# Patient Record
Sex: Male | Born: 1945 | Race: White | Hispanic: No | Marital: Single | Smoking: Never smoker
Health system: Southern US, Community
[De-identification: ages and names within clinical notes are randomized; demographics above are authoritative.]

## PROBLEM LIST (undated history)

## (undated) DIAGNOSIS — E119 Type 2 diabetes mellitus without complications: Secondary | ICD-10-CM

## (undated) DIAGNOSIS — K219 Gastro-esophageal reflux disease without esophagitis: Secondary | ICD-10-CM

## (undated) DIAGNOSIS — B20 Human immunodeficiency virus [HIV] disease: Secondary | ICD-10-CM

## (undated) DIAGNOSIS — K7290 Hepatic failure, unspecified without coma: Secondary | ICD-10-CM

## (undated) DIAGNOSIS — I8500 Esophageal varices without bleeding: Secondary | ICD-10-CM

## (undated) DIAGNOSIS — Z21 Asymptomatic human immunodeficiency virus [HIV] infection status: Secondary | ICD-10-CM

## (undated) HISTORY — PX: TONSILLECTOMY: SUR1361

## (undated) HISTORY — PX: SHOULDER SURGERY: SHX246

---

## 2017-02-01 ENCOUNTER — Encounter (HOSPITAL_BASED_OUTPATIENT_CLINIC_OR_DEPARTMENT_OTHER): Payer: Self-pay | Admitting: Emergency Medicine

## 2017-02-01 ENCOUNTER — Emergency Department (HOSPITAL_BASED_OUTPATIENT_CLINIC_OR_DEPARTMENT_OTHER): Payer: Medicare Other

## 2017-02-01 ENCOUNTER — Emergency Department (HOSPITAL_BASED_OUTPATIENT_CLINIC_OR_DEPARTMENT_OTHER)
Admission: EM | Admit: 2017-02-01 | Discharge: 2017-02-01 | Disposition: A | Discharge status: Home / Self Care | Payer: Medicare Other | Attending: Emergency Medicine | Admitting: Emergency Medicine

## 2017-02-01 DIAGNOSIS — E119 Type 2 diabetes mellitus without complications: Secondary | ICD-10-CM | POA: Insufficient documentation

## 2017-02-01 DIAGNOSIS — Z79899 Other long term (current) drug therapy: Secondary | ICD-10-CM | POA: Diagnosis not present

## 2017-02-01 DIAGNOSIS — S52571A Other intraarticular fracture of lower end of right radius, initial encounter for closed fracture: Secondary | ICD-10-CM | POA: Diagnosis not present

## 2017-02-01 DIAGNOSIS — S6991XA Unspecified injury of right wrist, hand and finger(s), initial encounter: Secondary | ICD-10-CM | POA: Diagnosis present

## 2017-02-01 DIAGNOSIS — Y9289 Other specified places as the place of occurrence of the external cause: Secondary | ICD-10-CM | POA: Diagnosis not present

## 2017-02-01 DIAGNOSIS — W01198A Fall on same level from slipping, tripping and stumbling with subsequent striking against other object, initial encounter: Secondary | ICD-10-CM | POA: Diagnosis not present

## 2017-02-01 DIAGNOSIS — Y999 Unspecified external cause status: Secondary | ICD-10-CM | POA: Insufficient documentation

## 2017-02-01 DIAGNOSIS — Z794 Long term (current) use of insulin: Secondary | ICD-10-CM | POA: Diagnosis not present

## 2017-02-01 DIAGNOSIS — Y93H2 Activity, gardening and landscaping: Secondary | ICD-10-CM | POA: Insufficient documentation

## 2017-02-01 HISTORY — DX: Type 2 diabetes mellitus without complications: E11.9

## 2017-02-01 HISTORY — DX: Gastro-esophageal reflux disease without esophagitis: K21.9

## 2017-02-01 HISTORY — DX: Asymptomatic human immunodeficiency virus (hiv) infection status: Z21

## 2017-02-01 HISTORY — DX: Human immunodeficiency virus (HIV) disease: B20

## 2017-02-01 HISTORY — DX: Esophageal varices without bleeding: I85.00

## 2017-02-01 HISTORY — DX: Hepatic failure, unspecified without coma: K72.90

## 2017-02-01 MED ORDER — HYDROCODONE-ACETAMINOPHEN 5-325 MG PO TABS: 1.0000 | ORAL_TABLET | Freq: Four times a day (QID) | ORAL | 0 refills | Status: AC | PRN

## 2017-02-01 MED ORDER — HYDROCODONE-ACETAMINOPHEN 5-325 MG PO TABS
2.0000 | ORAL_TABLET | Freq: Once | ORAL | Status: AC
  Administered 2017-02-01: 2 via ORAL
  Filled 2017-02-01: qty 2

## 2017-02-01 NOTE — ED Notes (Signed)
Patient transported to X-ray 

## 2017-02-01 NOTE — ED Provider Notes (Signed)
[facility]   CSN: [id number] Arrival date & time: 02/01/17  1640  By signing my name below, I, Nathan David, attest that this documentation has been prepared under the direction and in the presence of Geoffery Lyons, MD. Electronically Signed: Diona David, ED Scribe. 02/01/17. 6:30 PM.  History   Chief Complaint Chief Complaint  Patient presents with  . Fall    HPI Nathan David is a 71 y.o. male who presents to the Emergency Department complaining of right wrist and hand pain s/p falling earlier today. Pt reports he was gardening when he tripped over the garden hose falling forward onto his right side hitting his right chest and injuring his hand and wrist. No modifying factors noted. Pt denies LOC and head injury.  The history is provided by the patient. No language interpreter was used.  Fall  This is a new problem. The current episode started 3 to 5 hours ago. The problem occurs constantly. The problem has not changed since onset.Associated symptoms include chest pain.    Past Medical History:  Diagnosis Date  . Diabetes mellitus without complication (HCC)   . GERD (gastroesophageal reflux disease)   . HIV (human immunodeficiency virus infection) (HCC)   . Liver failure (HCC)   . Varices, esophageal (HCC)     There are no active problems to display for this patient.   Past Surgical History:  Procedure Laterality Date  . SHOULDER SURGERY    . TONSILLECTOMY       Home Medications    Prior to Admission medications   Medication Sig Start Date End Date Taking? Authorizing Provider  escitalopram (LEXAPRO) 20 MG tablet Take 20 mg by mouth daily.   Yes [provider]  insulin regular (NOVOLIN R,HUMULIN R) 100 units/mL injection Inject into the skin 3 (three) times daily before meals.   Yes [provider]  lactulose (CHRONULAC) 10 GM/15ML solution Take by mouth 3 (three) times daily.   Yes [provider]    metFORMIN (GLUCOPHAGE) 500 MG tablet Take by mouth 2 (two) times daily with a meal.   Yes [provider]    Family History No family history on file.  Social History Social History  Substance Use Topics  . Smoking status: Never Smoker  . Smokeless tobacco: Never Used  . Alcohol use Not on file     Allergies   Ddi [didanosine]   Review of Systems Review of Systems  Cardiovascular: Positive for chest pain.  Musculoskeletal: Positive for arthralgias and joint swelling.  Neurological: Negative for syncope.  All other systems reviewed and are negative.    Physical Exam Updated Vital Signs BP (!) 113/49 (BP Location: Right Arm)   Pulse 71   Temp 98.3 F (36.8 C) (Oral)   Resp 18   Ht 5\' 7"  (1.702 m)   Wt 160 lb (72.6 kg)   SpO2 100%   BMI 25.06 kg/m   Physical Exam  Constitutional: He is oriented to person, place, and time. He appears well-developed and well-nourished.  HENT:  Head: Normocephalic and atraumatic.  Eyes: EOM are normal.  Neck: Normal range of motion.  Cardiovascular: Normal rate, regular rhythm, normal heart sounds and intact distal pulses.   Pulmonary/Chest: Effort normal and breath sounds normal. No respiratory distress. He exhibits tenderness.  TTP over the right upper lateral chest wall. No crepitus.  Abdominal: Soft. He exhibits no distension. There is no tenderness.  Musculoskeletal: Normal range of motion.  Swelling and TTP over  the distal radius of the right upper extremity. Distal PMS intact.   Neurological: He is alert and oriented to person, place, and time.  Skin: Skin is warm and dry.  Psychiatric: He has a normal mood and affect. Judgment normal.  Nursing note and vitals reviewed.    ED Treatments / Results  DIAGNOSTIC STUDIES: Oxygen Saturation is 100% on RA, normal by my interpretation.  COORDINATION OF CARE: 6:30 PM-Discussed next steps with pt which includes a temporary splint. Pt will need to follow up with a  hand surgeon to get a hard cast. Pt verbalized understanding and is agreeable with the plan.   Labs (all labs ordered are listed, but only abnormal results are displayed) Labs Reviewed - No data to display  EKG  EKG Interpretation None       Radiology Dg Wrist Complete Right  Result Date: 02/01/2017 CLINICAL DATA:  Larey Seat while gardening today. Right hand and wrist pain. EXAM: RIGHT HAND - COMPLETE 3+ VIEW; RIGHT WRIST - COMPLETE 3+ VIEW COMPARISON:  None. FINDINGS: The joint spaces are maintained. Mild degenerative changes involving the hand. There is a remote healed fifth metacarpal neck fracture. No acute hand fractures. Wrist films demonstrate a minimally displaced intra-articular fracture of the distal radius. No ulnar styloid fracture. The carpal bones are intact. IMPRESSION: Intra-articular fracture of the distal radius with minimal volar displacement of a volar cortical component. No acute hand fractures. Electronically Signed   By: Rudie Meyer M.D.   On: 02/01/2017 17:44   Dg Hand Complete Right  Result Date: 02/01/2017 CLINICAL DATA:  Larey Seat while gardening today. Right hand and wrist pain. EXAM: RIGHT HAND - COMPLETE 3+ VIEW; RIGHT WRIST - COMPLETE 3+ VIEW COMPARISON:  None. FINDINGS: The joint spaces are maintained. Mild degenerative changes involving the hand. There is a remote healed fifth metacarpal neck fracture. No acute hand fractures. Wrist films demonstrate a minimally displaced intra-articular fracture of the distal radius. No ulnar styloid fracture. The carpal bones are intact. IMPRESSION: Intra-articular fracture of the distal radius with minimal volar displacement of a volar cortical component. No acute hand fractures. Electronically Signed   By: Rudie Meyer M.D.   On: 02/01/2017 17:44    Procedures Procedures (including critical care time)  Medications Ordered in ED Medications - No data to display   Initial Impression / Assessment and Plan / ED Course  I have  reviewed the triage vital signs and the nursing notes.  Pertinent labs & imaging results that were available during my care of the patient were reviewed by me and considered in my medical decision making (see chart for details).  Patient with history of HIV disease, liver failure, diabetes. He presents for evaluation of a right wrist injury. He reports falling today and injuring it. His x-rays reveal a comminuted, intra-articular, distal radius fracture. This finding has been discussed with Dr. Melvyn Novas from hand surgery who is willing to see the patient in follow-up. The patient does have an established orthopedist here in [facility] and will attempt to make follow-up with him, and will see Dr. Melvyn Novas if these arrangements cannot be made. He was placed in a sugar tong splint, advised ice, rest, elevate.  Final Clinical Impressions(s) / ED Diagnoses   Final diagnoses:  None    New Prescriptions New Prescriptions   No medications on file   I personally performed the services described in this documentation, which was scribed in my presence. The recorded information has been reviewed and is accurate.  Geoffery Lyonselo, Zia Najera, MD 02/01/17 2127

## 2017-02-01 NOTE — ED Notes (Addendum)
Pt reports right chest pain. Pt reports having shoulder surgery with pins placed in 2014, pt reports he has intermittent right chest pain since the surgery.

## 2017-02-01 NOTE — Discharge Instructions (Signed)
Wear splint as applied until followed up by your orthopedist.  Ice for 20 minutes every 2 hours while awake for the next 2 days.  You should follow-up with your orthopedic surgeon in the next 3-4 days. Call on Monday to make these arrangements.  Hydrocodone as prescribed as needed for pain.

## 2017-02-01 NOTE — ED Triage Notes (Signed)
Patient states that he was gardening and fell in the garden  - hurt her right wrist and hand

## 2017-02-01 NOTE — ED Notes (Signed)
Pt back in waiting room 

## 2018-06-06 ENCOUNTER — Emergency Department (HOSPITAL_BASED_OUTPATIENT_CLINIC_OR_DEPARTMENT_OTHER): Payer: Medicare Other

## 2018-06-06 ENCOUNTER — Other Ambulatory Visit: Payer: Self-pay

## 2018-06-06 ENCOUNTER — Emergency Department (HOSPITAL_BASED_OUTPATIENT_CLINIC_OR_DEPARTMENT_OTHER)
Admission: EM | Admit: 2018-06-06 | Discharge: 2018-06-06 | Disposition: A | Discharge status: Home / Self Care | Payer: Medicare Other | Attending: Emergency Medicine | Admitting: Emergency Medicine

## 2018-06-06 ENCOUNTER — Encounter (HOSPITAL_BASED_OUTPATIENT_CLINIC_OR_DEPARTMENT_OTHER): Payer: Self-pay | Admitting: *Deleted

## 2018-06-06 DIAGNOSIS — Y939 Activity, unspecified: Secondary | ICD-10-CM | POA: Insufficient documentation

## 2018-06-06 DIAGNOSIS — Z79899 Other long term (current) drug therapy: Secondary | ICD-10-CM | POA: Diagnosis not present

## 2018-06-06 DIAGNOSIS — Z21 Asymptomatic human immunodeficiency virus [HIV] infection status: Secondary | ICD-10-CM | POA: Diagnosis not present

## 2018-06-06 DIAGNOSIS — S99912A Unspecified injury of left ankle, initial encounter: Secondary | ICD-10-CM | POA: Diagnosis not present

## 2018-06-06 DIAGNOSIS — Y929 Unspecified place or not applicable: Secondary | ICD-10-CM | POA: Diagnosis not present

## 2018-06-06 DIAGNOSIS — S0990XA Unspecified injury of head, initial encounter: Secondary | ICD-10-CM | POA: Insufficient documentation

## 2018-06-06 DIAGNOSIS — Y999 Unspecified external cause status: Secondary | ICD-10-CM | POA: Diagnosis not present

## 2018-06-06 DIAGNOSIS — W11XXXA Fall on and from ladder, initial encounter: Secondary | ICD-10-CM | POA: Insufficient documentation

## 2018-06-06 DIAGNOSIS — E119 Type 2 diabetes mellitus without complications: Secondary | ICD-10-CM | POA: Diagnosis not present

## 2018-06-06 DIAGNOSIS — Z7984 Long term (current) use of oral hypoglycemic drugs: Secondary | ICD-10-CM | POA: Diagnosis not present

## 2018-06-06 DIAGNOSIS — Z7982 Long term (current) use of aspirin: Secondary | ICD-10-CM | POA: Insufficient documentation

## 2018-06-06 DIAGNOSIS — W19XXXA Unspecified fall, initial encounter: Secondary | ICD-10-CM

## 2018-06-06 NOTE — ED Notes (Signed)
ED Provider at bedside. 

## 2018-06-06 NOTE — ED Triage Notes (Signed)
He was on a ladder and the ladder fell apart. His head hit gravel and his left ribs hit the gravel. Hx of 5 broken ribs from a previous fall last week.

## 2018-06-06 NOTE — ED Provider Notes (Signed)
[facility] EMERGENCY DEPARTMENT Provider Note   CSN: [id number] Arrival date & time: 06/06/18  1554     History   Chief Complaint Chief Complaint  Patient presents with  . Fall    HPI Nathan David is a 72 y.o. male with a PMH of type 2 diabetes, HIV, liver failure who presents after sustaining a fall earlier today.  This is the patient's second fall in the last 2 weeks.  2 weeks ago, patient's dog pulled him down, causing him to fall and hit his left side, breaking 5 ribs on his left side.  He was examined at in the emergency department at that time and given Norco for pain relief.  Earlier today, patient was on his ladder when the ladder broke and fell, causing him to twist his left ankle and fall to the ground.  He hit the back of his head on gravel and also hit the previously injured left side of his chest.  He did not lose consciousness or have any seizure-like activity.  He now has pain along his left side, on his left foot, and on the back of his head.  He has no shortness of breath, chest pain, or cough.  He is able to walk on his left foot, but he has to limp.    Past Medical History:  Diagnosis Date  . Diabetes mellitus without complication (HCC)   . GERD (gastroesophageal reflux disease)   . HIV (human immunodeficiency virus infection) (HCC)   . Liver failure (HCC)   . Varices, esophageal (HCC)     There are no active problems to display for this patient.   Past Surgical History:  Procedure Laterality Date  . SHOULDER SURGERY    . TONSILLECTOMY          Home Medications    Prior to Admission medications   Medication Sig Start Date End Date Taking? Authorizing Provider  buPROPion (WELLBUTRIN) 100 MG tablet Take 100 mg by mouth 2 (two) times daily.   Yes [provider]  dimenhyDRINATE (DRAMAMINE) 50 MG tablet Take 50 mg by mouth every 8 (eight) hours as needed.   Yes [provider]  escitalopram (LEXAPRO) 20 MG tablet Take  20 mg by mouth daily.   Yes [provider]  ferrous sulfate 325 (65 FE) MG tablet Take 325 mg by mouth daily with breakfast.   Yes [provider]  insulin regular (NOVOLIN R,HUMULIN R) 100 units/mL injection Inject into the skin 3 (three) times daily before meals.   Yes [provider]  lactulose (CHRONULAC) 10 GM/15ML solution Take by mouth 3 (three) times daily.   Yes [provider]  metFORMIN (GLUCOPHAGE) 500 MG tablet Take by mouth 2 (two) times daily with a meal.   Yes [provider]  temazepam (RESTORIL) 15 MG capsule Take 15 mg by mouth at bedtime as needed for sleep.   Yes [provider]  acetaminophen (TYLENOL) 500 MG tablet Take 500 mg by mouth every 6 (six) hours as needed.    [provider]  aspirin EC 81 MG tablet Take 81 mg by mouth daily.    [provider]  emtricitabine-tenofovir (TRUVADA) 200-300 MG tablet Take 1 tablet by mouth daily.    [provider]  HYDROcodone-acetaminophen (NORCO/VICODIN) 5-325 MG tablet Take 1-2 tablets by mouth every 6 (six) hours as needed. 02/01/17   Geoffery Lyons, MD  raltegravir (ISENTRESS) 400 MG tablet Take 400 mg by mouth 2 (two) times daily.  [provider]    Family History No family history on file.  Social History Social History   Tobacco Use  . Smoking status: Never Smoker  . Smokeless tobacco: Never Used  Substance Use Topics  . Alcohol use: Not on file  . Drug use: Not on file     Allergies   Ddi [didanosine]   Review of Systems Review of Systems  Constitutional: Negative for activity change, appetite change, chills and fever.  HENT: Negative for congestion and rhinorrhea.   Respiratory: Negative for cough, chest tightness and shortness of breath.   Cardiovascular: Negative for chest pain and leg swelling.  Gastrointestinal: Negative for abdominal pain, nausea and vomiting.  Genitourinary: Negative for dysuria.    Musculoskeletal: Negative for back pain and neck pain.  Skin: Positive for wound.  Neurological: Positive for dizziness and headaches.  Psychiatric/Behavioral: Negative for confusion.     Physical Exam Updated Vital Signs BP (!) 166/63 (BP Location: Right Arm)   Pulse 74   Temp 98.2 F (36.8 C) (Oral)   Resp 16   Ht 5\' 7"  (1.702 m)   Wt 72.6 kg   SpO2 97%   BMI 25.07 kg/m   Physical Exam  Constitutional: He is oriented to person, place, and time. He appears well-developed. No distress.  Thin, elderly-appearing male  HENT:  Head: Normocephalic.  Right Ear: External ear normal.  Left Ear: External ear normal.  Nose: Nose normal.  Tender, soft tissue swelling in the left posterior parietal area of scalp  Eyes: Pupils are equal, round, and reactive to light. Conjunctivae and EOM are normal.  Neck: Normal range of motion. Neck supple.  Cardiovascular: Normal rate, regular rhythm and normal heart sounds.  No murmur heard. Pulmonary/Chest: Effort normal and breath sounds normal. No respiratory distress. He exhibits tenderness.  Abdominal: Soft. Bowel sounds are normal. There is no tenderness.  Musculoskeletal: Normal range of motion. He exhibits tenderness. He exhibits no edema or deformity.  To palpation along the fourth metatarsal of left foot; nontender on medial or lateral malleolus  Neurological: He is alert and oriented to person, place, and time.  Skin: Skin is warm and dry. He is not diaphoretic.  Superficial abrasions on left arm, bruising on bilateral arms  Psychiatric: He has a normal mood and affect. His behavior is normal. Thought content normal.     ED Treatments / Results  Labs (all labs ordered are listed, but only abnormal results are displayed) Labs Reviewed - No data to display  EKG None  Radiology Dg Chest 2 View  Result Date: 06/06/2018 CLINICAL DATA:  Fall from ladder. Left-sided chest pain. History of previous injury with fractures. EXAM:  CHEST - 2 VIEW COMPARISON:  06/02/2018 FINDINGS: Heart size is normal. Chronic interstitial lung markings appear similar. Old and recent rib fractures on the left as seen previously. No pneumothorax or hemothorax. No significant spinal finding. IMPRESSION: No active disease evident. Chronic interstitial lung markings. Old and recent rib fractures on the left. No pneumothorax or hemothorax. Electronically Signed   By: Paulina Fusi M.D.   On: 06/06/2018 19:39   Dg Ankle Complete Left  Result Date: 06/06/2018 CLINICAL DATA:  Left ankle pain after fall. EXAM: LEFT ANKLE COMPLETE - 3+ VIEW COMPARISON:  None. FINDINGS: There is no evidence of fracture, dislocation, or joint effusion. There is no evidence of arthropathy or other focal bone abnormality. Mild soft tissue swelling is seen over lateral malleolus. IMPRESSION: No fracture or dislocation is noted. Soft tissue  swelling is seen over lateral malleolus. Electronically Signed   By: Lupita Raider, M.D.   On: 06/06/2018 19:38   Ct Head Wo Contrast  Result Date: 06/06/2018 CLINICAL DATA:  Posttraumatic headache after fall. EXAM: CT HEAD WITHOUT CONTRAST TECHNIQUE: Contiguous axial images were obtained from the base of the skull through the vertex without intravenous contrast. COMPARISON:  CT scan of May 06, 2018. FINDINGS: Brain: No evidence of acute infarction, hemorrhage, hydrocephalus, extra-axial collection or mass lesion/mass effect. Vascular: No hyperdense vessel or unexpected calcification. Skull: Normal. Negative for fracture or focal lesion. Sinuses/Orbits: No acute finding. Other: Small left posterior scalp hematoma is noted. IMPRESSION: Small left posterior scalp hematoma. No acute intracranial abnormality seen. Electronically Signed   By: Lupita Raider, M.D.   On: 06/06/2018 19:11   Dg Foot Complete Left  Result Date: 06/06/2018 CLINICAL DATA:  Left foot injury suffered in a fall from a ladder today. Initial encounter. EXAM: LEFT FOOT  - COMPLETE 3+ VIEW COMPARISON:  None. FINDINGS: There is no evidence of fracture or dislocation. There is no evidence of arthropathy or other focal bone abnormality. Soft tissues are unremarkable. IMPRESSION: Negative exam. Electronically Signed   By: Drusilla Kanner M.D.   On: 06/06/2018 19:37    Procedures Procedures (including critical care time)  Medications Ordered in ED Medications - No data to display   Initial Impression / Assessment and Plan / ED Course  I have reviewed the triage vital signs and the nursing notes.  Pertinent labs & imaging results that were available during my care of the patient were reviewed by me and considered in my medical decision making (see chart for details).     Due to patient's age and frailty, we will obtain a CT CT head to rule out intracranial bleeding.  We will also obtain a chest x-ray and x-rays of patient's left foot and ankle.  All imaging studies are negative for acute abnormalities.  Patient is felt to be safe for discharge and advised to use the previously prescribed Percocet sparingly and not before driving.  Final Clinical Impressions(s) / ED Diagnoses   Final diagnoses:  Fall, initial encounter    ED Discharge Orders    None       Lennox Solders, MD 06/06/18 1955    Melene Plan, DO 06/06/18 2231

## 2019-06-13 ENCOUNTER — Encounter (HOSPITAL_BASED_OUTPATIENT_CLINIC_OR_DEPARTMENT_OTHER): Payer: Self-pay | Admitting: Emergency Medicine

## 2019-06-13 ENCOUNTER — Emergency Department (HOSPITAL_BASED_OUTPATIENT_CLINIC_OR_DEPARTMENT_OTHER)
Admission: EM | Admit: 2019-06-13 | Discharge: 2019-06-13 | Disposition: A | Discharge status: Home / Self Care | Payer: Medicare Other | Attending: Emergency Medicine | Admitting: Emergency Medicine

## 2019-06-13 ENCOUNTER — Emergency Department (HOSPITAL_BASED_OUTPATIENT_CLINIC_OR_DEPARTMENT_OTHER): Payer: Medicare Other

## 2019-06-13 ENCOUNTER — Other Ambulatory Visit: Payer: Self-pay

## 2019-06-13 DIAGNOSIS — W06XXXA Fall from bed, initial encounter: Secondary | ICD-10-CM | POA: Diagnosis not present

## 2019-06-13 DIAGNOSIS — Y929 Unspecified place or not applicable: Secondary | ICD-10-CM | POA: Diagnosis not present

## 2019-06-13 DIAGNOSIS — E119 Type 2 diabetes mellitus without complications: Secondary | ICD-10-CM | POA: Diagnosis not present

## 2019-06-13 DIAGNOSIS — R519 Headache, unspecified: Secondary | ICD-10-CM | POA: Diagnosis not present

## 2019-06-13 DIAGNOSIS — Y939 Activity, unspecified: Secondary | ICD-10-CM | POA: Insufficient documentation

## 2019-06-13 DIAGNOSIS — S40011A Contusion of right shoulder, initial encounter: Secondary | ICD-10-CM | POA: Diagnosis not present

## 2019-06-13 DIAGNOSIS — S0990XA Unspecified injury of head, initial encounter: Secondary | ICD-10-CM | POA: Diagnosis not present

## 2019-06-13 DIAGNOSIS — Y999 Unspecified external cause status: Secondary | ICD-10-CM | POA: Diagnosis not present

## 2019-06-13 DIAGNOSIS — S161XXA Strain of muscle, fascia and tendon at neck level, initial encounter: Secondary | ICD-10-CM | POA: Insufficient documentation

## 2019-06-13 DIAGNOSIS — Z21 Asymptomatic human immunodeficiency virus [HIV] infection status: Secondary | ICD-10-CM | POA: Diagnosis not present

## 2019-06-13 DIAGNOSIS — W19XXXA Unspecified fall, initial encounter: Secondary | ICD-10-CM

## 2019-06-13 MED ORDER — DICLOFENAC SODIUM 1 % TD GEL: 2.0000 g | Freq: Four times a day (QID) | TRANSDERMAL | 0 refills | Status: AC | PRN

## 2019-06-13 NOTE — ED Provider Notes (Signed)
Emergency Department Provider Note   I have reviewed the triage vital signs and the nursing notes.   HISTORY  Chief Complaint Fall   HPI Nathan David is a 73 y.o. male with PMH reviewed below presents to the emergency department for evaluation of right shoulder pain, bruising, headache since a fall approximately 7 days ago.  Patient states that he was on his bed approximately 2 feet off the ground when he was playing with his dog.  He states he was knocked over and fell onto his right side.  The patient's sister at bedside states that he has frequent falls and that he is "stubborn" when it comes to using his walker/cane consistently.  He did strike his head during the fall 1 week ago but denies loss of consciousness.  He is not anticoagulated.  He has had soreness in the right shoulder but is able to move it.  He noticed some bruising and so went to the doctor today.  Denies numbness or tingling in the arm.  He has mild right-sided neck discomfort.  Given the injury pattern and continued headache patient saw his PCP who directed him to the emergency department for imaging.    Past Medical History:  Diagnosis Date   Diabetes mellitus without complication (HCC)    GERD (gastroesophageal reflux disease)    HIV (human immunodeficiency virus infection) ([place])    Liver failure (HCC)    Varices, esophageal ([place])     There are no active problems to display for this patient.   Past Surgical History:  Procedure Laterality Date   SHOULDER SURGERY     TONSILLECTOMY      Allergies Ddi [didanosine]  No family history on file.  Social History Social History   Tobacco Use   Smoking status: Never Smoker   Smokeless tobacco: Never Used  Substance Use Topics   Alcohol use: Not Currently   Drug use: Yes    Types: Marijuana    Review of Systems  Constitutional: No fever/chills Eyes: No visual changes. ENT: No sore throat. Cardiovascular: Denies chest  pain. Respiratory: Denies shortness of breath. Gastrointestinal: No abdominal pain.  No nausea, no vomiting.  No diarrhea.  No constipation. Genitourinary: Negative for dysuria. Musculoskeletal: Negative for back pain. Positive right shoulder pain.  Skin: Negative for rash. Neurological: Negative for focal weakness or numbness. Positive HA.   10-point ROS otherwise negative.  ____________________________________________   PHYSICAL EXAM:  VITAL SIGNS: ED Triage Vitals  Enc Vitals Group     BP 06/13/19 1553 (!) 119/52     Pulse Rate 06/13/19 1553 72     Resp 06/13/19 1553 18     Temp 06/13/19 1553 98.8 F (37.1 C)     Temp Source 06/13/19 1553 Oral     SpO2 06/13/19 1553 98 %     Weight 06/13/19 1551 151 lb (68.5 kg)     Height 06/13/19 1551 5\' 7"  (1.702 m)   Constitutional: Alert and oriented. Well appearing and in no acute distress. Eyes: Conjunctivae are normal.  Head: Atraumatic. Nose: No congestion/rhinnorhea. Mouth/Throat: Mucous membranes are moist.  Neck: No stridor.  No midline cervical spine tenderness.  Mild right paraspinal tenderness over the cervical spine region. Cardiovascular: Normal rate, regular rhythm. Good peripheral circulation. Respiratory: Normal respiratory effort. Gastrointestinal: No distention.  Musculoskeletal: Bruising over the right lateral shoulder with somewhat reduced range of motion of the right shoulder especially with abduction beyond 90 degrees. Neurologic:  Normal speech and language. No gross focal  neurologic deficits are appreciated.  Skin:  Skin is warm, dry and intact. No rash noted.  ____________________________________________  RADIOLOGY  Dg Shoulder Right  Result Date: 06/13/2019 CLINICAL DATA:  Patient fell 9 days ago, with shoulder pain. EXAM: RIGHT SHOULDER - 2+ VIEW COMPARISON:  Shoulder radiographs dated 01/26/2019 FINDINGS: The patient is status post open reduction internal fixation with plate and screws for a proximal  right humeral fracture. The hardware is intact and well aligned. There is no evidence of fracture or dislocation. There is no evidence of arthropathy or other focal bone abnormality. Soft tissues are unremarkable. IMPRESSION: Negative for fracture or dislocation. No evidence of hardware complication. Electronically Signed   By: Romona Curls M.D.   On: 06/13/2019 17:54   Ct Head Wo Contrast  Result Date: 06/13/2019 CLINICAL DATA:  Follow bed 1 week ago with head pain. EXAM: CT HEAD WITHOUT CONTRAST CT CERVICAL SPINE WITHOUT CONTRAST TECHNIQUE: Multidetector CT imaging of the head and cervical spine was performed following the standard protocol without intravenous contrast. Multiplanar CT image reconstructions of the cervical spine were also generated. COMPARISON:  CT head dated 06/06/2018. FINDINGS: CT HEAD FINDINGS Brain: No evidence of acute infarction, hemorrhage, hydrocephalus, extra-axial collection or mass lesion/mass effect. Vascular: There are vascular calcifications in the carotid siphons. Skull: Normal. Negative for fracture or focal lesion. Sinuses/Orbits: There is a right maxillary sinus disease. Other: None. CT CERVICAL SPINE FINDINGS Alignment: Normal. Skull base and vertebrae: No acute fracture. No primary bone lesion or focal pathologic process. Soft tissues and spinal canal: No prevertebral fluid or swelling. No visible canal hematoma. Disc levels: Degenerative disc and joint disease is seen in the cervical spine, most significant at C6-7. Degenerative changes are also seen at the craniocervical junction. Upper chest: Negative. Other: None. IMPRESSION: 1. No acute intracranial process. 2. No acute osseous injury in the cervical spine. Electronically Signed   By: Romona Curls M.D.   On: 06/13/2019 18:00   Ct Cervical Spine Wo Contrast  Result Date: 06/13/2019 CLINICAL DATA:  Follow bed 1 week ago with head pain. EXAM: CT HEAD WITHOUT CONTRAST CT CERVICAL SPINE WITHOUT CONTRAST TECHNIQUE:  Multidetector CT imaging of the head and cervical spine was performed following the standard protocol without intravenous contrast. Multiplanar CT image reconstructions of the cervical spine were also generated. COMPARISON:  CT head dated 06/06/2018. FINDINGS: CT HEAD FINDINGS Brain: No evidence of acute infarction, hemorrhage, hydrocephalus, extra-axial collection or mass lesion/mass effect. Vascular: There are vascular calcifications in the carotid siphons. Skull: Normal. Negative for fracture or focal lesion. Sinuses/Orbits: There is a right maxillary sinus disease. Other: None. CT CERVICAL SPINE FINDINGS Alignment: Normal. Skull base and vertebrae: No acute fracture. No primary bone lesion or focal pathologic process. Soft tissues and spinal canal: No prevertebral fluid or swelling. No visible canal hematoma. Disc levels: Degenerative disc and joint disease is seen in the cervical spine, most significant at C6-7. Degenerative changes are also seen at the craniocervical junction. Upper chest: Negative. Other: None. IMPRESSION: 1. No acute intracranial process. 2. No acute osseous injury in the cervical spine. Electronically Signed   By: Romona Curls M.D.   On: 06/13/2019 18:00    ____________________________________________   PROCEDURES  Procedure(s) performed:   Procedures  None  ____________________________________________   INITIAL IMPRESSION / ASSESSMENT AND PLAN / ED COURSE  Pertinent labs & imaging results that were available during my care of the patient were reviewed by me and considered in my medical decision making (see chart for  details).   Patient presents to the emergency department with continued headache and right shoulder pain 1 week after fall.  Patient with frequent falls at home.  Will obtain CT imaging of the head to rule out subacute bleed.  Patient with some neck discomfort and pain in the arm.  Will obtain CT C-spine plain film of the right shoulder.  Patient is  neurovascularly intact in the upper and lower extremities.   CT scans of the head and cervical spine reviewed no acute findings.  Right shoulder x-rays with no acute findings.  Plan for symptom management and PCP follow-up.  Discussed ED return precautions in detail. ____________________________________________  FINAL CLINICAL IMPRESSION(S) / ED DIAGNOSES  Final diagnoses:  Fall, initial encounter  Injury of head, initial encounter  Strain of neck muscle, initial encounter  Contusion of right shoulder, initial encounter    NEW OUTPATIENT MEDICATIONS STARTED DURING THIS VISIT:  Discharge Medication List as of 06/13/2019  6:08 PM    START taking these medications   Details  diclofenac sodium (VOLTAREN) 1 % GEL Apply 2 g topically 4 (four) times daily as needed., Starting Sat 06/13/2019, Print        Note:  This document was prepared using Dragon voice recognition software and may include unintentional dictation errors.  Alona BeneJoshua Jameca Chumley, MD, [facility] Emergency Medicine    Xaviar Lunn, Arlyss RepressJoshua G, MD 06/13/19 [phone]

## 2019-06-13 NOTE — ED Triage Notes (Addendum)
Pt rolled out of bed 1 week ago. C/o R shoulder and head pain. PCP sent him for CT scan. No blood thinners.

## 2019-06-13 NOTE — Discharge Instructions (Signed)
You were seen in the Emergency Department (ED) today for a head injury. Your CT scan today was normal. You may be experiencing signs of a concussion.   Symptoms to expect from a concussion include nausea, mild to moderate headache, difficulty concentrating or sleeping, and mild lightheadedness.  These symptoms should improve over the next few days to weeks, but it may take many weeks before you feel back to normal.  Return to the emergency department or follow-up with your primary care doctor if your symptoms are not improving over this time.  Signs of a more serious head injury include vomiting, severe headache, excessive sleepiness or confusion, and weakness or numbness in your face, arms or legs.  Return immediately to the Emergency Department if you experience any of these more concerning symptoms.    Rest, avoid strenuous physical or mental activity, and avoid activities that could potentially result in another head injury until all your symptoms from this head injury are completely resolved for at least 2-3 weeks. You may take ibuprofen or acetaminophen over the counter according to label instructions for mild headache or scalp soreness.

## 2019-07-11 ENCOUNTER — Emergency Department (HOSPITAL_BASED_OUTPATIENT_CLINIC_OR_DEPARTMENT_OTHER)
Admission: EM | Admit: 2019-07-11 | Discharge: 2019-07-11 | Disposition: A | Discharge status: Home / Self Care | Payer: Medicare Other | Attending: Emergency Medicine | Admitting: Emergency Medicine

## 2019-07-11 ENCOUNTER — Encounter (HOSPITAL_BASED_OUTPATIENT_CLINIC_OR_DEPARTMENT_OTHER): Payer: Self-pay | Admitting: Emergency Medicine

## 2019-07-11 ENCOUNTER — Other Ambulatory Visit: Payer: Self-pay

## 2019-07-11 ENCOUNTER — Emergency Department (HOSPITAL_BASED_OUTPATIENT_CLINIC_OR_DEPARTMENT_OTHER): Payer: Medicare Other

## 2019-07-11 DIAGNOSIS — E119 Type 2 diabetes mellitus without complications: Secondary | ICD-10-CM | POA: Insufficient documentation

## 2019-07-11 DIAGNOSIS — W06XXXA Fall from bed, initial encounter: Secondary | ICD-10-CM | POA: Diagnosis not present

## 2019-07-11 DIAGNOSIS — Y939 Activity, unspecified: Secondary | ICD-10-CM | POA: Diagnosis not present

## 2019-07-11 DIAGNOSIS — Z7982 Long term (current) use of aspirin: Secondary | ICD-10-CM | POA: Insufficient documentation

## 2019-07-11 DIAGNOSIS — Y998 Other external cause status: Secondary | ICD-10-CM | POA: Diagnosis not present

## 2019-07-11 DIAGNOSIS — S0990XA Unspecified injury of head, initial encounter: Secondary | ICD-10-CM | POA: Insufficient documentation

## 2019-07-11 DIAGNOSIS — Y92003 Bedroom of unspecified non-institutional (private) residence as the place of occurrence of the external cause: Secondary | ICD-10-CM | POA: Diagnosis not present

## 2019-07-11 DIAGNOSIS — Z21 Asymptomatic human immunodeficiency virus [HIV] infection status: Secondary | ICD-10-CM | POA: Diagnosis not present

## 2019-07-11 DIAGNOSIS — S161XXA Strain of muscle, fascia and tendon at neck level, initial encounter: Secondary | ICD-10-CM

## 2019-07-11 DIAGNOSIS — Z79899 Other long term (current) drug therapy: Secondary | ICD-10-CM | POA: Insufficient documentation

## 2019-07-11 DIAGNOSIS — W19XXXA Unspecified fall, initial encounter: Secondary | ICD-10-CM

## 2019-07-11 LAB — CBC
HCT: 36.7 % — ABNORMAL LOW (ref 39.0–52.0)
Hemoglobin: 11.6 g/dL — ABNORMAL LOW (ref 13.0–17.0)
MCH: 31.6 pg (ref 26.0–34.0)
MCHC: 31.6 g/dL (ref 30.0–36.0)
MCV: 100 fL (ref 80.0–100.0)
Platelets: 37 10*3/uL — ABNORMAL LOW (ref 150–400)
RBC: 3.67 MIL/uL — ABNORMAL LOW (ref 4.22–5.81)
RDW: 13.8 % (ref 11.5–15.5)
WBC: 1.9 10*3/uL — ABNORMAL LOW (ref 4.0–10.5)
nRBC: 0 % (ref 0.0–0.2)

## 2019-07-11 LAB — BASIC METABOLIC PANEL
Anion gap: 8 (ref 5–15)
BUN: 13 mg/dL (ref 8–23)
CO2: 26 mmol/L (ref 22–32)
Calcium: 8.7 mg/dL — ABNORMAL LOW (ref 8.9–10.3)
Chloride: 105 mmol/L (ref 98–111)
Creatinine, Ser: 1.09 mg/dL (ref 0.61–1.24)
GFR calc Af Amer: >60 mL/min (ref >60)
GFR calc non Af Amer: >60 mL/min (ref >60)
Glucose, Bld: 175 mg/dL — ABNORMAL HIGH (ref 70–99)
Potassium: 4.1 mmol/L (ref 3.5–5.1)
Sodium: 139 mmol/L (ref 135–145)

## 2019-07-11 NOTE — ED Provider Notes (Signed)
Medical screening examination/treatment/procedure(s) were conducted as a shared visit with non-physician practitioner(s) and myself.  I personally evaluated the patient during the encounter.      ED ECG REPORT   Date: 07/11/2019  Rate: 73  Rhythm: normal sinus rhythm  QRS Axis: normal  Intervals: QT prolonged  ST/T Wave abnormalities: nonspecific ST changes  Conduction Disutrbances:none  Narrative Interpretation:   Old EKG Reviewed: none available  I have personally reviewed the EKG tracing and agree with the computerized printout as noted.   Patient with a history of HIV followed at [facility] is on antivirals.  Patient presenting today for evaluation of right shoulder pain bruising headache since a fall approximately 7 days ago.  Patient fell off his bed up approximately 2 feet off the ground when he was playing with his dog.  Family member state that he has frequent falls.  He is not anticoagulated.  Denies any loss of consciousness.  CT head neck will be done today.  Shoulder x-rays will not be done today because they have been x-rayed in the past.   Fredia Sorrow, MD 07/11/19 1423

## 2019-07-11 NOTE — ED Triage Notes (Signed)
Pt here after falling twice this week and becoming very weak and dizzy since then. No blood thinners.

## 2019-07-11 NOTE — ED Provider Notes (Signed)
[facility] EMERGENCY DEPARTMENT Provider Note   CSN: [id number] Arrival date & time: 07/11/19  1150     History   Chief Complaint Chief Complaint  Patient presents with  . Weakness  . Concussion  . Fall    HPI Nathan David is a 73 y.o. male.     HPI  Patient is 73 year old male presented today for a fall that occurred 1 week ago.  Patient states he has had persistent headache since the fall and right shoulder pain.   Patient states that over the past 2 months he has had multiple falls typically these occur because he is not using his cane which she was directed use by his PCP. Patient's sister is at bedside states that he is inconsistent with cane use.  Patient states that 1 week ago he was walking up the stairs walking with the dog holding onto leash when the dog pulled him forward and he tripped falling headfirst into a door at the top of the stairs.  Patient states that he hit the front of his head and has had headaches since that are intermittent and mild.  Patient denies any focal weakness, sensation changes, slurred speech, altered mental status confusion.  States that he did not lose consciousness during the fall or after and was able to ambulate without difficulty although he felt lightheaded.  Patient states that over the past 2 to 3 months he has been increasingly lightheaded when standing up.  Denies any persistent lightheadedness other than when going from a seated to a standing position.  States that he has been told to fluid restrict as a result of his heart failure.  States he has been compliant with this direction.  Denies any chest pain, palpitations, shortness of breath, nausea, vomiting.   Patient states that his last fall was 10/24 and he had no abnormalities on his evaluation at that time.  States that he is scheduled for cataract surgery on his right eye within the week and wanted to be medically cleared.  Patient denies any dizziness, vertigo,  difficulty walking apart from baseline weakness.  Patient states he came to ED today because he is having eye surgery done within the week and felt he might need medical clearance before the operation.   Past Medical History:  Diagnosis Date  . Diabetes mellitus without complication (HCC)   . GERD (gastroesophageal reflux disease)   . HIV (human immunodeficiency virus infection) (HCC)   . Liver failure (HCC)   . Varices, esophageal (HCC)     There are no active problems to display for this patient.   Past Surgical History:  Procedure Laterality Date  . SHOULDER SURGERY    . TONSILLECTOMY          Home Medications    Prior to Admission medications   Medication Sig Start Date End Date Taking? Authorizing Provider  acetaminophen (TYLENOL) 500 MG tablet Take 500 mg by mouth every 6 (six) hours as needed.    [provider]  aspirin EC 81 MG tablet Take 81 mg by mouth daily.    [provider]  buPROPion (WELLBUTRIN) 100 MG tablet Take 100 mg by mouth 2 (two) times daily.    [provider]  diclofenac sodium (VOLTAREN) 1 % GEL Apply 2 g topically 4 (four) times daily as needed. 06/13/19   Long, Arlyss RepressJoshua G, MD  dimenhyDRINATE (DRAMAMINE) 50 MG tablet Take 50 mg by mouth every 8 (eight) hours as needed.    [provider]  emtricitabine-tenofovir (TRUVADA) 200-300 MG tablet Take 1 tablet by mouth daily.    [provider]  escitalopram (LEXAPRO) 20 MG tablet Take 20 mg by mouth daily.    [provider]  ferrous sulfate 325 (65 FE) MG tablet Take 325 mg by mouth daily with breakfast.    [provider]  HYDROcodone-acetaminophen (NORCO/VICODIN) 5-325 MG tablet Take 1-2 tablets by mouth every 6 (six) hours as needed. 02/01/17   Geoffery Lyons, MD  insulin regular (NOVOLIN R,HUMULIN R) 100 units/mL injection Inject into the skin 3 (three) times daily before meals.    [provider]  lactulose (CHRONULAC) 10 GM/15ML  solution Take by mouth 3 (three) times daily.    [provider]  metFORMIN (GLUCOPHAGE) 500 MG tablet Take by mouth 2 (two) times daily with a meal.    [provider]  raltegravir (ISENTRESS) 400 MG tablet Take 400 mg by mouth 2 (two) times daily.    [provider]  temazepam (RESTORIL) 15 MG capsule Take 15 mg by mouth at bedtime as needed for sleep.    [provider]    Family History History reviewed. No pertinent family history.  Social History Social History   Tobacco Use  . Smoking status: Never Smoker  . Smokeless tobacco: Never Used  Substance Use Topics  . Alcohol use: Not Currently  . Drug use: Yes    Types: Marijuana     Allergies   Ddi [didanosine]   Review of Systems Review of Systems  Constitutional: Negative for chills, diaphoresis and fever.  HENT: Negative for congestion.   Respiratory: Negative for cough, chest tightness and shortness of breath.   Cardiovascular: Negative for chest pain.  Gastrointestinal: Negative for abdominal pain.  Musculoskeletal: Positive for gait problem (Chronic nonfocal).  Neurological: Positive for weakness, light-headedness and headaches. Negative for dizziness and syncope.     Physical Exam Updated Vital Signs BP (!) 125/59 (BP Location: Right Arm)   Pulse 74   Temp (!) 97.5 F (36.4 C) (Oral)   Resp 15   SpO2 97%   Physical Exam Vitals signs and nursing note reviewed.  Constitutional:      General: He is not in acute distress.    Comments: Patient is elderly, 73 year old gentleman pleasant appears stated age, able answer questions and follow commands.  In no acute distress, appears chronically frail.  HENT:     Head: Normocephalic and atraumatic.     Nose: Nose normal.  Eyes:     General: No scleral icterus. Neck:     Musculoskeletal: Normal range of motion.  Cardiovascular:     Rate and Rhythm: Normal rate and regular rhythm.     Pulses: Normal pulses.     Heart  sounds: Normal heart sounds.  Pulmonary:     Effort: Pulmonary effort is normal. No respiratory distress.     Breath sounds: No wheezing.  Abdominal:     Palpations: Abdomen is soft.     Tenderness: There is no abdominal tenderness.  Musculoskeletal:     Right lower leg: No edema.     Left lower leg: No edema.     Comments: Right shoulder with muscular tenderness over the right deltoid.  Full range of motion of right shoulder without pain with active and passive range.   No bony tenderness over joints or long bones of the upper and lower extremities.   No neck or back midline tenderness, step-off, deformity, or bruising. Able to turn  head left and right 45 degrees without difficulty.  Full range of motion of upper and lower extremity joints shown after palpation was conducted; with 5/5 symmetrical strength in upper and lower extremities. No chest wall tenderness, no facial or cranial tenderness.   Patient has intact sensation grossly in lower and upper extremities. Intact patellar and ankle reflexes. Patient able to ambulate without difficulty.  Radial and DP pulses palpated BL.    Skin:    General: Skin is warm and dry.     Capillary Refill: Capillary refill takes less than 2 seconds.  Neurological:     Mental Status: He is alert. Mental status is at baseline.  Psychiatric:        Mood and Affect: Mood normal.        Behavior: Behavior normal.      ED Treatments / Results  Labs (all labs ordered are listed, but only abnormal results are displayed) Labs Reviewed  BASIC METABOLIC PANEL - Abnormal; Notable for the following components:      Result Value   Glucose, Bld 175 (*)    Calcium 8.7 (*)    All other components within normal limits  CBC - Abnormal; Notable for the following components:   WBC 1.9 (*)    RBC 3.67 (*)    Hemoglobin 11.6 (*)    HCT 36.7 (*)    Platelets 37 (*)    All other components within normal limits    EKG None   ED ECG REPORT SEE  ATTENDING ATTESTATION.   Radiology Ct Head Wo Contrast  Result Date: 07/11/2019 CLINICAL DATA:  Multiple recent falls with head injury.  Neck pain. EXAM: CT HEAD WITHOUT CONTRAST CT CERVICAL SPINE WITHOUT CONTRAST TECHNIQUE: Multidetector CT imaging of the head and cervical spine was performed following the standard protocol without intravenous contrast. Multiplanar CT image reconstructions of the cervical spine were also generated. COMPARISON:  06/13/2019 FINDINGS: CT HEAD FINDINGS Brain: There is no evidence for acute hemorrhage, hydrocephalus, mass lesion, or abnormal extra-axial fluid collection. No definite CT evidence for acute infarction. Diffuse loss of parenchymal volume is consistent with atrophy. Patchy low attenuation in the deep hemispheric and periventricular white matter is nonspecific, but likely reflects chronic microvascular ischemic demyelination. Vascular: No hyperdense vessel or unexpected calcification. Skull: No evidence for fracture. No worrisome lytic or sclerotic lesion. Sinuses/Orbits: Chronic mucosal disease again noted right maxillary sinus. Visualized portions of the globes and intraorbital fat are unremarkable. Other: None. CT CERVICAL SPINE FINDINGS Alignment: Normal. Skull base and vertebrae: No acute fracture. No primary bone lesion or focal pathologic process. Soft tissues and spinal canal: No prevertebral fluid or swelling. No visible canal hematoma. Disc levels:  Loss of disc height noted C6-7. Upper chest: Unremarkable. Other: None. IMPRESSION: 1. No acute intracranial abnormality. 2. No cervical spine fracture. Mild loss of disc height noted at C6-7. Electronically Signed   By: Kennith Center M.D.   On: 07/11/2019 13:56   Ct Cervical Spine Wo Contrast  Result Date: 07/11/2019 CLINICAL DATA:  Multiple recent falls with head injury.  Neck pain. EXAM: CT HEAD WITHOUT CONTRAST CT CERVICAL SPINE WITHOUT CONTRAST TECHNIQUE: Multidetector CT imaging of the head and cervical  spine was performed following the standard protocol without intravenous contrast. Multiplanar CT image reconstructions of the cervical spine were also generated. COMPARISON:  06/13/2019 FINDINGS: CT HEAD FINDINGS Brain: There is no evidence for acute hemorrhage, hydrocephalus, mass lesion, or abnormal extra-axial fluid collection. No definite CT evidence for acute infarction. Diffuse  loss of parenchymal volume is consistent with atrophy. Patchy low attenuation in the deep hemispheric and periventricular white matter is nonspecific, but likely reflects chronic microvascular ischemic demyelination. Vascular: No hyperdense vessel or unexpected calcification. Skull: No evidence for fracture. No worrisome lytic or sclerotic lesion. Sinuses/Orbits: Chronic mucosal disease again noted right maxillary sinus. Visualized portions of the globes and intraorbital fat are unremarkable. Other: None. CT CERVICAL SPINE FINDINGS Alignment: Normal. Skull base and vertebrae: No acute fracture. No primary bone lesion or focal pathologic process. Soft tissues and spinal canal: No prevertebral fluid or swelling. No visible canal hematoma. Disc levels:  Loss of disc height noted C6-7. Upper chest: Unremarkable. Other: None. IMPRESSION: 1. No acute intracranial abnormality. 2. No cervical spine fracture. Mild loss of disc height noted at C6-7. Electronically Signed   By: Misty Stanley M.D.   On: 07/11/2019 13:56    Procedures Procedures (including critical care time)  Medications Ordered in ED Medications - No data to display   Initial Impression / Assessment and Plan / ED Course  I have reviewed the triage vital signs and the nursing notes.  Pertinent labs & imaging results that were available during my care of the patient were reviewed by me and considered in my medical decision making (see chart for details).        Patient is 73 year old male presenting today for fall that occurred 1 week ago.  Patient complains of  headaches, right shoulder pain, neck pain.  Ordered CT of head and cervical spine.  Ordered BMP and CBC to evaluate for anemia or hypoglycemia as patient has been progressively more weak for the past 2 months.  CT head and C-spine conducted in order to evaluate for acute abnormality.  Degenerative changes found in cervical spine however no acute abnormality.   EKG reviewed by Dr. Rogene Houston and myself. QT prolongation, no acute abnormalities concerning. History of HIV on antivirals with similarly low WBC in 2018.  Apart from mild anemia which is stable and low platelets at 37 is consistent with last physical CBC done in 2018 which was a similar value.  Otherwise reassuring blood work and vital signs.  Likely patient's weakness is explained by deconditioning and progression of age.  No acute findings on EKG, blood work, CT scans to indicate emergency condition today.  Will recommend follow-up with primary care and infectious disease with reports that he continues to see them.  Strongly encourage patient ambulate with walker or cane.  Discussed that he should no longer walk dog.   Final Clinical Impressions(s) / ED Diagnoses   Final diagnoses:  Injury of head, initial encounter  Fall, initial encounter  Strain of neck muscle, initial encounter    ED Discharge Orders    None       Tedd Sias, [place] 07/12/19 5035    Fredia Sorrow, MD 07/12/19 0900

## 2019-07-11 NOTE — ED Provider Notes (Signed)
Medical screening examination/treatment/procedure(s) were conducted as a shared visit with non-physician practitioner(s) and myself.  I personally evaluated the patient during the encounter.      Patient seen by me along with physician assistant.  See my other note.   Fredia Sorrow, MD 07/11/19 1424

## 2019-07-11 NOTE — Discharge Instructions (Addendum)
Follow-up with your primary care doctor please refrain from walking the dog and please use cane or consider using walker for ambulation.  Please use Tylenol ibuprofen for pain.

## 2021-09-29 ENCOUNTER — Emergency Department (HOSPITAL_BASED_OUTPATIENT_CLINIC_OR_DEPARTMENT_OTHER): Payer: Medicare Other

## 2021-09-29 ENCOUNTER — Other Ambulatory Visit: Payer: Self-pay

## 2021-09-29 ENCOUNTER — Encounter (HOSPITAL_BASED_OUTPATIENT_CLINIC_OR_DEPARTMENT_OTHER): Payer: Self-pay

## 2021-09-29 ENCOUNTER — Emergency Department (HOSPITAL_BASED_OUTPATIENT_CLINIC_OR_DEPARTMENT_OTHER)
Admission: EM | Admit: 2021-09-29 | Discharge: 2021-09-29 | Disposition: A | Discharge status: Home / Self Care | Payer: Medicare Other | Attending: Emergency Medicine | Admitting: Emergency Medicine

## 2021-09-29 DIAGNOSIS — Z7982 Long term (current) use of aspirin: Secondary | ICD-10-CM | POA: Diagnosis not present

## 2021-09-29 DIAGNOSIS — B20 Human immunodeficiency virus [HIV] disease: Secondary | ICD-10-CM | POA: Insufficient documentation

## 2021-09-29 DIAGNOSIS — E860 Dehydration: Secondary | ICD-10-CM | POA: Insufficient documentation

## 2021-09-29 DIAGNOSIS — Z794 Long term (current) use of insulin: Secondary | ICD-10-CM | POA: Diagnosis not present

## 2021-09-29 DIAGNOSIS — E1165 Type 2 diabetes mellitus with hyperglycemia: Secondary | ICD-10-CM | POA: Insufficient documentation

## 2021-09-29 DIAGNOSIS — Z7984 Long term (current) use of oral hypoglycemic drugs: Secondary | ICD-10-CM | POA: Diagnosis not present

## 2021-09-29 DIAGNOSIS — R519 Headache, unspecified: Secondary | ICD-10-CM | POA: Insufficient documentation

## 2021-09-29 DIAGNOSIS — N390 Urinary tract infection, site not specified: Secondary | ICD-10-CM | POA: Insufficient documentation

## 2021-09-29 DIAGNOSIS — R739 Hyperglycemia, unspecified: Secondary | ICD-10-CM

## 2021-09-29 DIAGNOSIS — R5383 Other fatigue: Secondary | ICD-10-CM | POA: Diagnosis present

## 2021-09-29 LAB — URINALYSIS, ROUTINE W REFLEX MICROSCOPIC
Bilirubin Urine: NEGATIVE
Glucose, UA: NEGATIVE mg/dL
Hgb urine dipstick: NEGATIVE
Ketones, ur: NEGATIVE mg/dL
Nitrite: NEGATIVE
Protein, ur: NEGATIVE mg/dL
Specific Gravity, Urine: 1.01 (ref 1.005–1.030)
pH: 5.5 (ref 5.0–8.0)

## 2021-09-29 LAB — CBC WITH DIFFERENTIAL/PLATELET
Abs Immature Granulocytes: 0.01 10*3/uL (ref 0.00–0.07)
Basophils Absolute: 0 10*3/uL (ref 0.0–0.1)
Basophils Relative: 0 %
Eosinophils Absolute: 0.1 10*3/uL (ref 0.0–0.5)
Eosinophils Relative: 2 %
HCT: 34 % — ABNORMAL LOW (ref 39.0–52.0)
Hemoglobin: 11.4 g/dL — ABNORMAL LOW (ref 13.0–17.0)
Immature Granulocytes: 0 %
Lymphocytes Relative: 26 %
Lymphs Abs: 0.7 10*3/uL (ref 0.7–4.0)
MCH: 31.5 pg (ref 26.0–34.0)
MCHC: 33.5 g/dL (ref 30.0–36.0)
MCV: 93.9 fL (ref 80.0–100.0)
Monocytes Absolute: 0.2 10*3/uL (ref 0.1–1.0)
Monocytes Relative: 6 %
Neutro Abs: 1.7 10*3/uL (ref 1.7–7.7)
Neutrophils Relative %: 66 %
Platelets: 39 10*3/uL — ABNORMAL LOW (ref 150–400)
RBC: 3.62 MIL/uL — ABNORMAL LOW (ref 4.22–5.81)
RDW: 13.7 % (ref 11.5–15.5)
WBC: 2.7 10*3/uL — ABNORMAL LOW (ref 4.0–10.5)
nRBC: 0 % (ref 0.0–0.2)

## 2021-09-29 LAB — URINALYSIS, MICROSCOPIC (REFLEX): RBC / HPF: NONE SEEN RBC/hpf (ref 0–5)

## 2021-09-29 LAB — COMPREHENSIVE METABOLIC PANEL
ALT: 21 U/L (ref 0–44)
AST: 33 U/L (ref 15–41)
Albumin: 3.3 g/dL — ABNORMAL LOW (ref 3.5–5.0)
Alkaline Phosphatase: 107 U/L (ref 38–126)
Anion gap: 12 (ref 5–15)
BUN: 18 mg/dL (ref 8–23)
CO2: 23 mmol/L (ref 22–32)
Calcium: 9.1 mg/dL (ref 8.9–10.3)
Chloride: 96 mmol/L — ABNORMAL LOW (ref 98–111)
Creatinine, Ser: 1.25 mg/dL — ABNORMAL HIGH (ref 0.61–1.24)
GFR, Estimated: 60 mL/min — ABNORMAL LOW (ref >60)
Glucose, Bld: 573 mg/dL (ref 70–99)
Potassium: 4.1 mmol/L (ref 3.5–5.1)
Sodium: 131 mmol/L — ABNORMAL LOW (ref 135–145)
Total Bilirubin: 0.8 mg/dL (ref 0.3–1.2)
Total Protein: 7.1 g/dL (ref 6.5–8.1)

## 2021-09-29 LAB — CBG MONITORING, ED: Glucose-Capillary: 369 mg/dL — ABNORMAL HIGH (ref 70–99)

## 2021-09-29 MED ORDER — CEPHALEXIN 500 MG PO CAPS: 500.0000 mg | ORAL_CAPSULE | Freq: Three times a day (TID) | ORAL | 0 refills | Status: AC

## 2021-09-29 MED ORDER — PROCHLORPERAZINE EDISYLATE 10 MG/2ML IJ SOLN
5.0000 mg | Freq: Once | INTRAMUSCULAR | Status: AC
Start: 2021-09-29 — End: 2021-09-29
  Administered 2021-09-29: 5 mg via INTRAVENOUS
  Filled 2021-09-29: qty 2

## 2021-09-29 MED ORDER — SODIUM CHLORIDE 0.9 % IV BOLUS
1000.0000 mL | Freq: Once | INTRAVENOUS | Status: AC
Start: 2021-09-29 — End: 2021-09-29
  Administered 2021-09-29: 1000 mL via INTRAVENOUS

## 2021-09-29 MED ORDER — DIPHENHYDRAMINE HCL 50 MG/ML IJ SOLN
25.0000 mg | Freq: Once | INTRAMUSCULAR | Status: AC
  Administered 2021-09-29: 25 mg via INTRAVENOUS
  Filled 2021-09-29: qty 1

## 2021-09-29 NOTE — Discharge Instructions (Signed)
Please follow-up with your regular doctor regarding your symptoms from today.  Please monitor your sugars at home and follow the insulin regimen that was previously prescribed.  Discussed your long-term insulin regimen with your primary doctor.  Recommend taking antibiotic as prescribed for suspected UTI.  If you develop abdominal pain, vomiting, fever or other new concerning symptom, please return to ER for reassessment.

## 2021-09-29 NOTE — ED Provider Notes (Signed)
[facility] EMERGENCY DEPARTMENT Provider Note   CSN: [id number] Arrival date & time: 09/29/21  1251     History  Chief Complaint  Patient presents with   Fatigue    Nathan David is a 76 y.o. male.  76 yo M with a chief complaints of episodic headaches fatigue going on for at least 7 months he thinks.  He has seen his family doctor for this off and on.  Has a history of HIV was diagnosed in the 37s and has been on therapy since.  He has seen his doctor for these recurrent headaches with no obvious etiology.  He takes medicine for chronic migraines.  He denies cough or congestion denies shortness of breath denies any significant change that brought him in today.       Home Medications Prior to Admission medications   Medication Sig Start Date End Date Taking? Authorizing Provider  acetaminophen (TYLENOL) 500 MG tablet Take 500 mg by mouth every 6 (six) hours as needed.    [provider]  aspirin EC 81 MG tablet Take 81 mg by mouth daily.    [provider]  buPROPion (WELLBUTRIN) 100 MG tablet Take 100 mg by mouth 2 (two) times daily.    [provider]  diclofenac sodium (VOLTAREN) 1 % GEL Apply 2 g topically 4 (four) times daily as needed. 06/13/19   Long, Arlyss Repress, MD  dimenhyDRINATE (DRAMAMINE) 50 MG tablet Take 50 mg by mouth every 8 (eight) hours as needed.    [provider]  emtricitabine-tenofovir (TRUVADA) 200-300 MG tablet Take 1 tablet by mouth daily.    [provider]  escitalopram (LEXAPRO) 20 MG tablet Take 20 mg by mouth daily.    [provider]  ferrous sulfate 325 (65 FE) MG tablet Take 325 mg by mouth daily with breakfast.    [provider]  HYDROcodone-acetaminophen (NORCO/VICODIN) 5-325 MG tablet Take 1-2 tablets by mouth every 6 (six) hours as needed. 02/01/17   Geoffery Lyons, MD  insulin regular (NOVOLIN R,HUMULIN R) 100 units/mL injection Inject into the skin 3 (three) times  daily before meals.    [provider]  lactulose (CHRONULAC) 10 GM/15ML solution Take by mouth 3 (three) times daily.    [provider]  metFORMIN (GLUCOPHAGE) 500 MG tablet Take by mouth 2 (two) times daily with a meal.    [provider]  raltegravir (ISENTRESS) 400 MG tablet Take 400 mg by mouth 2 (two) times daily.    [provider]  temazepam (RESTORIL) 15 MG capsule Take 15 mg by mouth at bedtime as needed for sleep.    [provider]      Allergies    Ddi [didanosine]    Review of Systems   Review of Systems  Physical Exam Updated Vital Signs BP (!) 101/56 (BP Location: Right Arm)    Pulse 70    Temp 98.2 F (36.8 C) (Oral)    Resp 10    SpO2 95%  Physical Exam Vitals and nursing note reviewed.  Constitutional:      Appearance: He is well-developed.     Comments: cachectic  HENT:     Head: Normocephalic and atraumatic.  Eyes:     Pupils: Pupils are equal, round, and reactive to light.  Neck:     Vascular: No JVD.  Cardiovascular:     Rate and Rhythm: Normal rate and regular rhythm.     Heart sounds: No murmur heard.  No friction rub. No gallop.  Pulmonary:     Effort: No respiratory distress.     Breath sounds: No wheezing.  Abdominal:     General: There is no distension.     Tenderness: There is no abdominal tenderness. There is no guarding or rebound.  Musculoskeletal:        General: Normal range of motion.     Cervical back: Normal range of motion and neck supple.  Skin:    Coloration: Skin is not pale.     Findings: No rash.  Neurological:     Mental Status: He is alert and oriented to person, place, and time.  Psychiatric:        Behavior: Behavior normal.    ED Results / Procedures / Treatments   Labs (all labs ordered are listed, but only abnormal results are displayed) Labs Reviewed  CBC WITH DIFFERENTIAL/PLATELET - Abnormal; Notable for the following components:      Result Value   WBC 2.7 (*)     RBC 3.62 (*)    Hemoglobin 11.4 (*)    HCT 34.0 (*)    Platelets 39 (*)    All other components within normal limits  COMPREHENSIVE METABOLIC PANEL - Abnormal; Notable for the following components:   Sodium 131 (*)    Chloride 96 (*)    Glucose, Bld 573 (*)    Creatinine, Ser 1.25 (*)    Albumin 3.3 (*)    GFR, Estimated 60 (*)    All other components within normal limits  URINALYSIS, ROUTINE W REFLEX MICROSCOPIC  CBG MONITORING, ED    EKG None  Radiology No results found.  Procedures Procedures    Medications Ordered in ED Medications  sodium chloride 0.9 % bolus 1,000 mL (has no administration in time range)  sodium chloride 0.9 % bolus 1,000 mL (1,000 mLs Intravenous New Bag/Given 09/29/21 1431)  prochlorperazine (COMPAZINE) injection 5 mg (5 mg Intravenous Given 09/29/21 1433)  diphenhydrAMINE (BENADRYL) injection 25 mg (25 mg Intravenous Given 09/29/21 1432)    ED Course/ Medical Decision Making/ A&P                           Medical Decision Making Amount and/or Complexity of Data Reviewed Labs: ordered. Radiology: ordered.  Risk Prescription drug management.   76 yo M with a cc of fatigue.  Going on for months.  Nothing particularly different as far as he knows.  His daughter was somewhat insistent that he come to be seen.  He is now not to eat and drink as much as they think he needs to.  Concern mostly from family that he is dehydrated.  On my record review he has seen his physician fairly frequently.  Has a history of headaches and this does not sound significantly different.  Has not had head imaging in about 6 months.  We will obtain a CT scan of the head.  Blood work.  IV fluids.  Headache cocktail.  Reassess.  Significant hyperglycemia no anion gap or acidosis.  Will give a second bolus of IV fluids.  Recheck blood sugar and give insulin if needed.  Patient care signed out to Dr. Stevie Kern, please see his note for further details of care in the  ED.  The patients results and plan were reviewed and discussed.   Any x-rays performed were independently reviewed by myself.   Differential diagnosis were considered with the presenting HPI.  Medications  sodium  chloride 0.9 % bolus 1,000 mL (has no administration in time range)  sodium chloride 0.9 % bolus 1,000 mL (1,000 mLs Intravenous New Bag/Given 09/29/21 1431)  prochlorperazine (COMPAZINE) injection 5 mg (5 mg Intravenous Given 09/29/21 1433)  diphenhydrAMINE (BENADRYL) injection 25 mg (25 mg Intravenous Given 09/29/21 1432)    Vitals:   09/29/21 1309 09/29/21 1400 09/29/21 1522  BP: (!) 97/58 (!) 111/50 (!) 101/56  Pulse: 74 72 70  Resp: 16 14 10   Temp: 98.2 F (36.8 C)    TempSrc: Oral    SpO2: 99% 95% 95%    Final diagnoses:  Dehydration  Hyperglycemia            Final Clinical Impression(s) / ED Diagnoses Final diagnoses:  Dehydration  Hyperglycemia    Rx / DC Orders ED Discharge Orders     None         , DO 09/29/21 1533

## 2021-09-29 NOTE — ED Provider Notes (Signed)
Sign out note  76 y/o male with extensive pmh including HIV, DM presenting for fatigue for past few months. Basic labs stable except noted hyperglycemia. At time of sign out plan to provide fluids, recheck cbg and likely dc home with plan to f/u with pcp for further management.   His repeat sugar is improved after some fluids.  His UA shows possible UTI.  Patient states all of his symptoms have improved since receiving the fluids.  Will give Rx for UTI, recommended strict return precautions should symptoms worsen otherwise follow-up with PCP.   Lucrezia Starch, MD 09/29/21 2253

## 2021-09-29 NOTE — ED Triage Notes (Signed)
Pt c/o fatigue x 2-3 months-states he "collapsed last week"-states he was seen by infectious disease last week-NAD- to triage in w/c

## 2021-09-29 NOTE — ED Notes (Signed)
Patient transported to CT 

## 2022-06-20 DEATH — deceased

## 2022-09-28 IMAGING — CT CT HEAD W/O CM
3 series · 15 of 47 positions shown, 18 images · non-contrast
Comparison: 07/11/2019

CLINICAL DATA: New or worsening headache, fatigue for 2-3 months,
collapsed last week, history HIV, diabetes mellitus, liver failure,
esophageal varices



[Series 2: head wo · axial · 0.40mm/px · z∈[+928,+1064]mm · 9 of 33 slices shown, 12 images]
[im 3/33  brain]
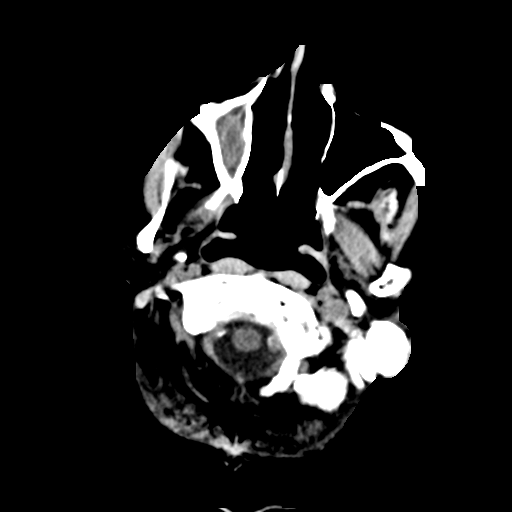
[im 3/33  bone]
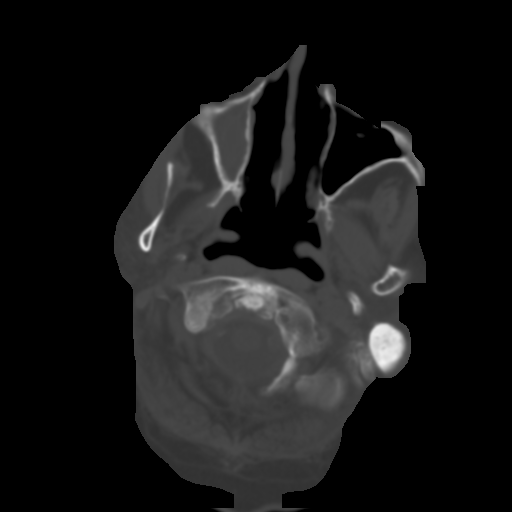
[im 6/33  brain]
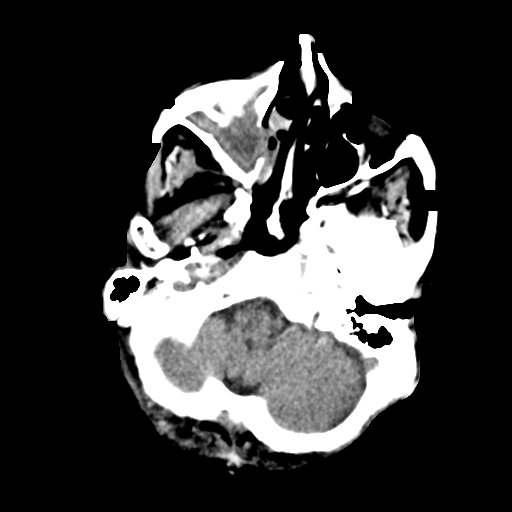
[im 9/33  brain]
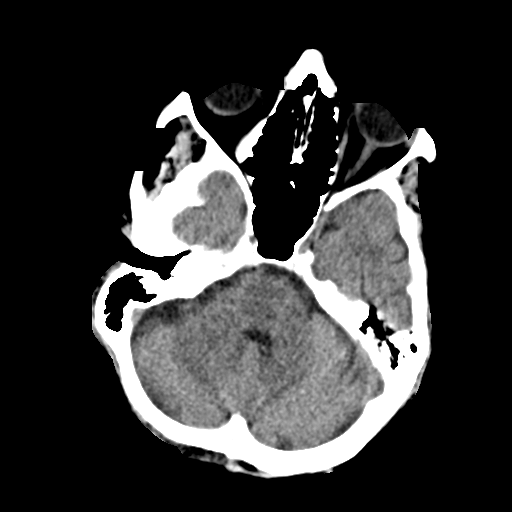
[im 13/33  brain]
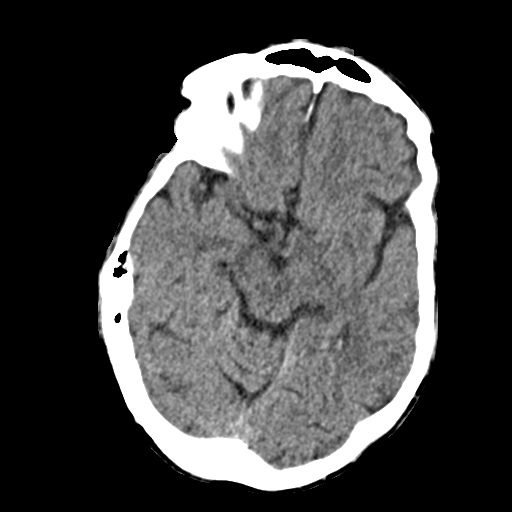
[im 17/33  brain]
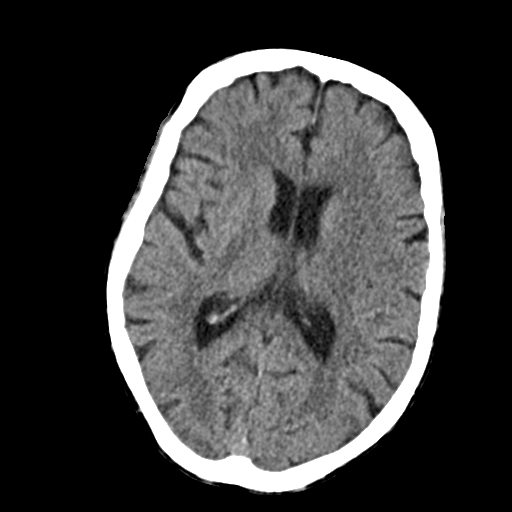
[im 17/33  bone]
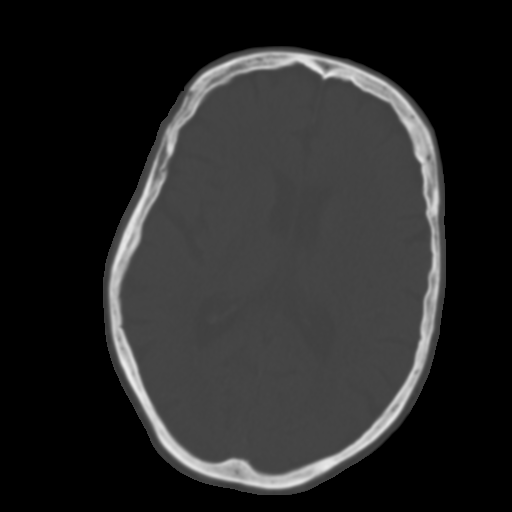
[im 20/33  brain]
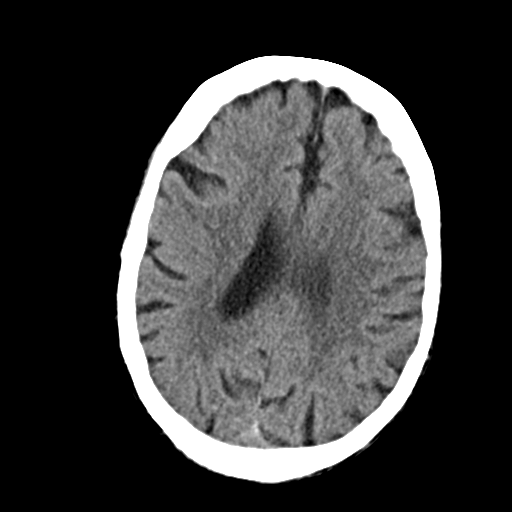
[im 24/33  brain]
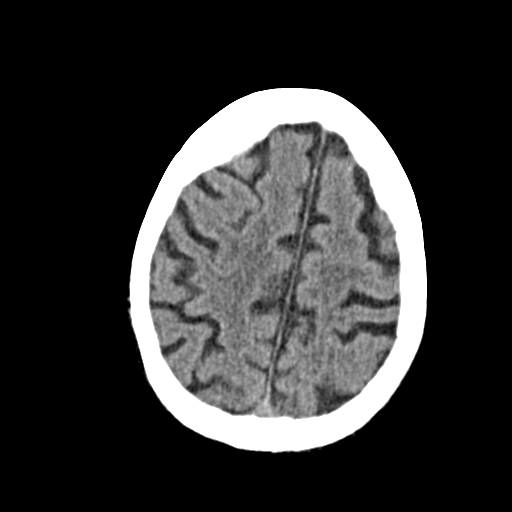
[im 27/33  brain]
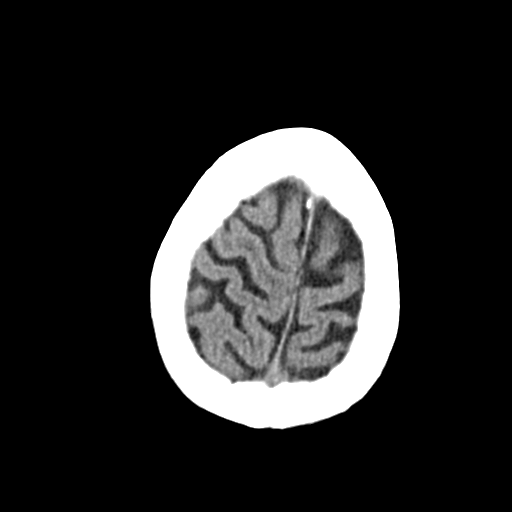
[im 30/33  brain]
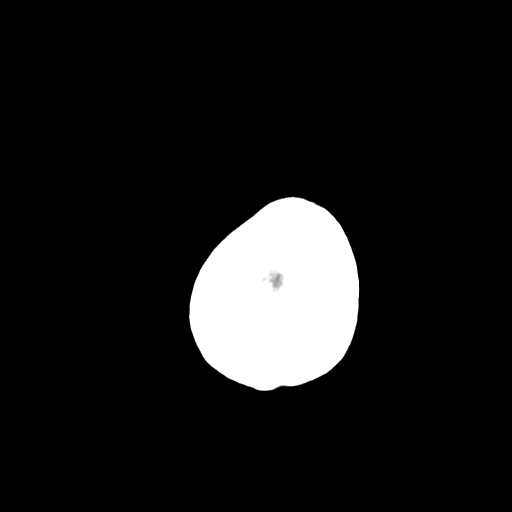
[im 30/33  bone]
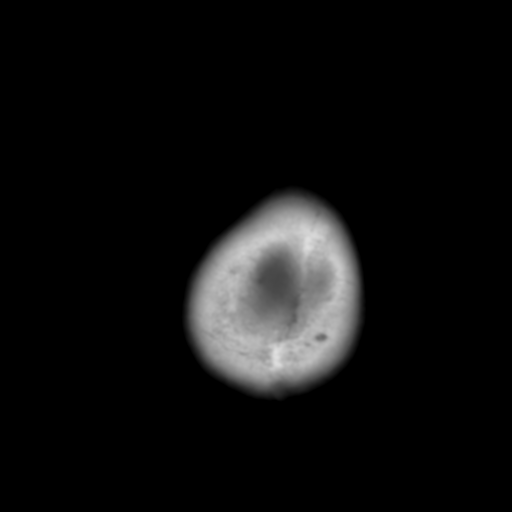

[Series 4: coronal soft · coronal · 0.31mm/px · 3 of 69 slices shown]
[im 23/69  brain]
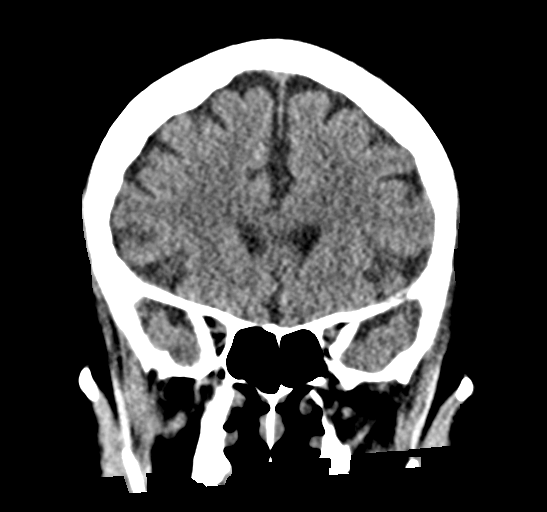
[im 31/69  brain]
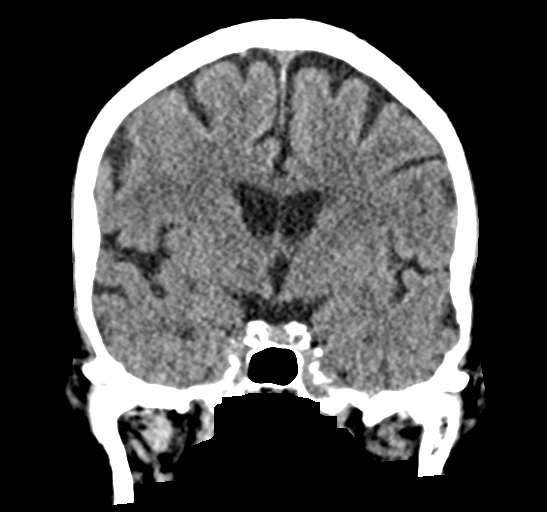
[im 38/69  brain]
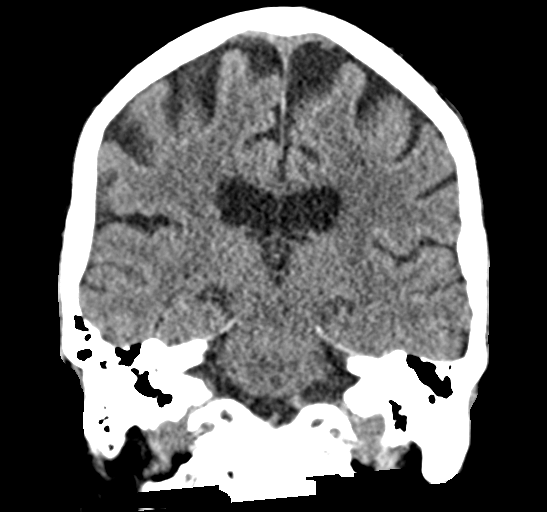

[Series 5: sag soft · sagittal · 0.31mm/px · 3 of 58 slices shown]
[im 20/58  brain]
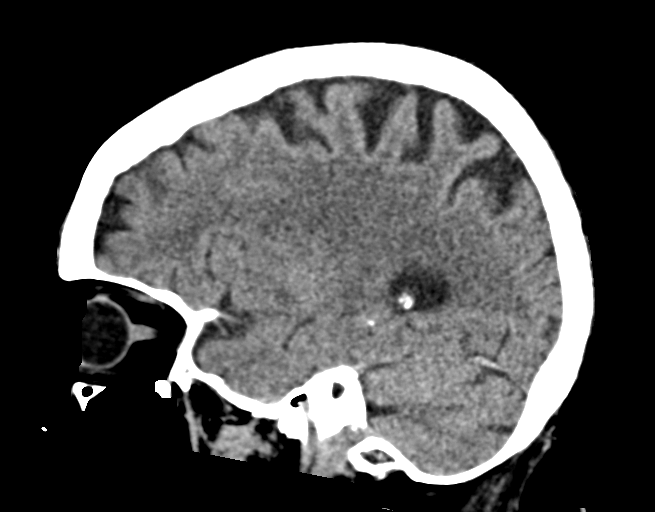
[im 29/58  brain]
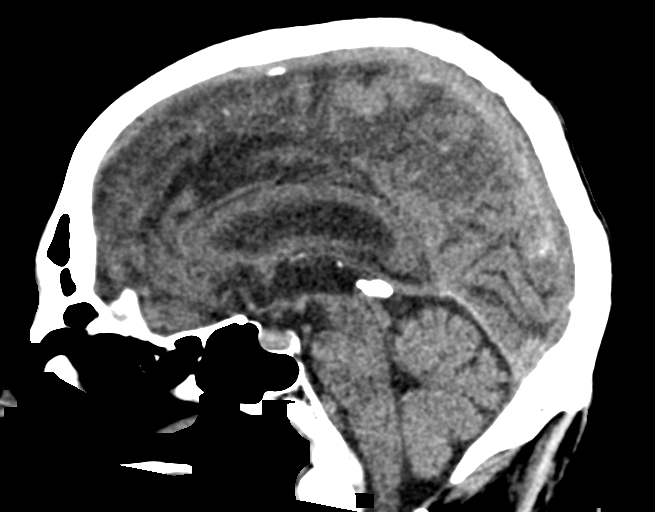
[im 39/58  brain]
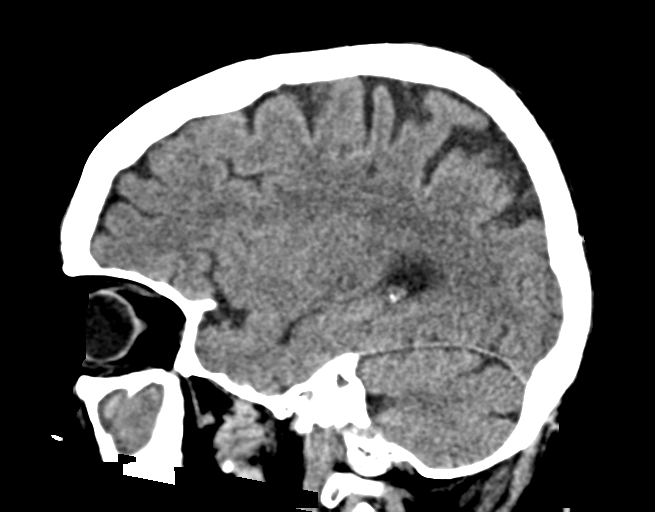

[15 of 47 positions shown; findings below may reference images not displayed]

FINDINGS: Brain: Generalized atrophy. Normal ventricular morphology. No
midline shift or mass effect. Small vessel chronic ischemic changes
of deep cerebral white matter. No intracranial hemorrhage, mass
lesion, evidence of acute infarction, or extra-axial fluid
collection.

Vascular: Atherosclerotic calcification of internal carotid arteries
at skull base. No hyperdense vessels.

Skull: Demineralized but intact

Sinuses/Orbits: Progressive opacification of the RIGHT maxillary
sinus with slight osseous wall thickening consistent with chronic
sinusitis. Remaining paranasal sinuses and mastoid air cells clear

Other: N/A
IMPRESSION: Atrophy with small vessel chronic ischemic changes of deep cerebral
white matter.

No acute intracranial abnormalities.

Chronic RIGHT maxillary sinusitis.
